# Patient Record
Sex: Female | Born: 1960 | Race: White | Hispanic: No | Marital: Married | Smoking: Former smoker
Health system: Southern US, Community
[De-identification: ages and names within clinical notes are randomized; demographics above are authoritative.]

## PROBLEM LIST (undated history)

## (undated) DIAGNOSIS — K219 Gastro-esophageal reflux disease without esophagitis: Secondary | ICD-10-CM

## (undated) DIAGNOSIS — E119 Type 2 diabetes mellitus without complications: Secondary | ICD-10-CM

## (undated) DIAGNOSIS — Z98891 History of uterine scar from previous surgery: Secondary | ICD-10-CM

## (undated) DIAGNOSIS — I10 Essential (primary) hypertension: Secondary | ICD-10-CM

---

## 2017-06-11 ENCOUNTER — Encounter (HOSPITAL_BASED_OUTPATIENT_CLINIC_OR_DEPARTMENT_OTHER): Payer: Self-pay | Admitting: *Deleted

## 2017-06-11 ENCOUNTER — Emergency Department (HOSPITAL_BASED_OUTPATIENT_CLINIC_OR_DEPARTMENT_OTHER): Payer: Worker's Compensation

## 2017-06-11 ENCOUNTER — Emergency Department (HOSPITAL_BASED_OUTPATIENT_CLINIC_OR_DEPARTMENT_OTHER)
Admission: EM | Admit: 2017-06-11 | Discharge: 2017-06-11 | Disposition: A | Discharge status: Home / Self Care | Payer: Worker's Compensation | Attending: Emergency Medicine | Admitting: Emergency Medicine

## 2017-06-11 DIAGNOSIS — W19XXXA Unspecified fall, initial encounter: Secondary | ICD-10-CM

## 2017-06-11 DIAGNOSIS — Z87891 Personal history of nicotine dependence: Secondary | ICD-10-CM | POA: Insufficient documentation

## 2017-06-11 DIAGNOSIS — I10 Essential (primary) hypertension: Secondary | ICD-10-CM | POA: Insufficient documentation

## 2017-06-11 DIAGNOSIS — M25561 Pain in right knee: Secondary | ICD-10-CM | POA: Diagnosis present

## 2017-06-11 DIAGNOSIS — E119 Type 2 diabetes mellitus without complications: Secondary | ICD-10-CM | POA: Diagnosis not present

## 2017-06-11 DIAGNOSIS — S8991XA Unspecified injury of right lower leg, initial encounter: Secondary | ICD-10-CM

## 2017-06-11 HISTORY — DX: Type 2 diabetes mellitus without complications: E11.9

## 2017-06-11 HISTORY — DX: Gastro-esophageal reflux disease without esophagitis: K21.9

## 2017-06-11 HISTORY — DX: Essential (primary) hypertension: I10

## 2017-06-11 HISTORY — DX: History of uterine scar from previous surgery: Z98.891

## 2017-06-11 NOTE — ED Provider Notes (Signed)
[facility] Provider Note   CSN: [id number] Arrival date & time: 06/11/17  1548     History   Chief Complaint Chief Complaint  Patient presents with  . Fall    HPI Jaime Rosales is a 56 y.o. female.  HPI   Jaime Rosales is a 56 y.o. female, with a history of DM and HTN, presenting to the ED with Primarily right knee pain following a fall that occurred earlier today. Patient rates her pain 2/10 while at rest, aching, located infrapatellar, nonradiating. Patient states she slipped and landed on her left knee and then her right knee. She has not taken medications for her symptoms prior to arrival. She is previously seen an orthopedist for an injury to her left knee, but cannot recall who this was. Denies head injury, LOC, neck/back pain, nausea/vomiting, numbness, tingling, weakness, or any other complaints.       Past Medical History:  Diagnosis Date  . Diabetes mellitus without complication (HCC)   . GERD (gastroesophageal reflux disease)   . Hx of cesarean section   . Hypertension     There are no active problems to display for this patient.   No past surgical history on file.  OB History    No data available       Home Medications    Prior to Admission medications   Not on File    Family History No family history on file.  Social History Social History  Substance Use Topics  . Smoking status: Former Games developer  . Smokeless tobacco: Not on file  . Alcohol use 0.6 oz/week    1 Glasses of wine per week     Allergies   Patient has no known allergies.   Review of Systems Review of Systems  Gastrointestinal: Negative for nausea and vomiting.  Musculoskeletal: Positive for arthralgias. Negative for back pain, joint swelling and neck pain.  Neurological: Negative for weakness, numbness and headaches.     Physical Exam Updated Vital Signs BP 134/72 (BP Location: Left Arm)   Pulse 90   Temp 98.9 F (37.2 C) (Oral)   Resp 20   Ht 5\' 2"   (1.575 m)   Wt 90.7 kg (200 lb)   SpO2 98%   BMI 36.58 kg/m   Physical Exam  Constitutional: She appears well-developed and well-nourished. No distress.  HENT:  Head: Normocephalic and atraumatic.  Eyes: Conjunctivae are normal.  Neck: Neck supple.  Cardiovascular: Normal rate, regular rhythm and intact distal pulses.   Pulmonary/Chest: Effort normal.  Musculoskeletal: She exhibits tenderness. She exhibits no edema or deformity.  Some tenderness on the right knee inferior to the patella. Patella is in correct anatomical position. Full passive and active range of motion in the bilateral hips, knees, and ankles. Patient is weightbearing without assistance.  No noted swelling, crepitus, deformity, laxity, or effusion with either knee. No tenderness with the left knee. No midline spinal tenderness.  Neurological: She is alert.  No noted sensory deficits in lower extremities. Strength with flexion and extension 5/5 in the bilateral hips, knees, and ankles. No gait deficit.  Skin: Skin is warm and dry. Capillary refill takes less than 2 seconds. She is not diaphoretic.  Psychiatric: She has a normal mood and affect. Her behavior is normal.  Nursing note and vitals reviewed.    ED Treatments / Results  Labs (all labs ordered are listed, but only abnormal results are displayed) Labs Reviewed - No data to display  EKG  EKG Interpretation  None       Radiology Dg Knee Complete 4 Views Left  Result Date: 06/11/2017 CLINICAL DATA:  56 year old female with bilateral knee pain status post fall, right greater than left. EXAM: LEFT KNEE - COMPLETE 4+ VIEW COMPARISON:  None. FINDINGS: No evidence of fracture, dislocation, or joint effusion. No evidence of arthropathy or other focal bone abnormality. Soft tissues are unremarkable. IMPRESSION: Negative. Electronically Signed   By: Sande Brothers M.D.   On: 06/11/2017 17:03   Dg Knee Complete 4 Views Right  Result Date: 06/11/2017 CLINICAL  DATA:  56 year old female with bilateral knee pain status post fall, right greater than left. EXAM: RIGHT KNEE - COMPLETE 4+ VIEW COMPARISON:  None. FINDINGS: No evidence of fracture or dislocation. There is a small suprapatellar effusion. No evidence of arthropathy or other focal bone abnormality. Soft tissues are unremarkable. IMPRESSION: Small suprapatellar effusion without fracture or dislocation. Electronically Signed   By: Sande Brothers M.D.   On: 06/11/2017 17:02    Procedures Procedures (including critical care time)  Medications Ordered in ED Medications - No data to display   Initial Impression / Assessment and Plan / ED Course  I have reviewed the triage vital signs and the nursing notes.  Pertinent labs & imaging results that were available during my care of the patient were reviewed by me and considered in my medical decision making (see chart for details).     Patient presents for evaluation of a knee injury. No noted significant abnormality on exam. No significant acute abnormality on x-ray. Orthopedic follow-up. Patient ambulatory. The patient was given instructions for home care as well as return precautions. Patient voices understanding of these instructions, accepts the plan, and is comfortable with discharge.     Final Clinical Impressions(s) / ED Diagnoses   Final diagnoses:  Fall, initial encounter  Injury of right knee, initial encounter    New Prescriptions There are no discharge medications for this patient.    Anselm Pancoast, PA-C 06/12/17 0157    Melene Plan, DO 06/12/17 2120

## 2017-06-11 NOTE — Discharge Instructions (Signed)
You have been seen today for a knee injury. There were no acute abnormalities on the x-rays, including no sign of fracture or dislocation. Pain: Take 600 mg of ibuprofen every 6 hours or 440 mg (over the counter dose) to 500 mg (prescription dose) of naproxen every 12 hours or for the next 3 days. After this time, these medications may be used as needed for pain. Take these medications with food to avoid upset stomach. Choose only one of these medications, do not take them together.  Tylenol: Should you continue to have additional pain while taking the ibuprofen or naproxen, you may add in tylenol as needed. Your daily total maximum amount of tylenol from all sources should be limited to 4000mg /day for persons without liver problems, or 2000mg /day for those with liver problems. Ice: May apply ice to the area over the next 24 hours for 15 minutes at a time to reduce swelling. Elevation: Keep the extremity elevated as often as possible to reduce pain and inflammation. Support: Wear the knee sleeve for support and comfort. Wear this until pain resolves. You will be weight-bearing as tolerated, which means you can slowly start to put weight on the extremity and increase amount and frequency as pain allows. Exercises: Start by performing these exercises a few times a week, increasing the frequency until you are performing them twice daily.  Follow up: Follow up with a PCP or orthopedist on this matter.

## 2017-06-11 NOTE — ED Triage Notes (Signed)
Pt states she fell today at work, slipped on wet fluid, fell on left knee first, then rt knee. Denies hitting head or any other complaints. Rest improves pain, but getting up and down and ambulating makes pain worse in knees. Denies any knee surgeries

## 2021-01-21 ENCOUNTER — Telehealth: Payer: Self-pay | Admitting: *Deleted

## 2021-01-21 NOTE — Telephone Encounter (Signed)
Per referral 01/21/21 - called and gave patient upcoming appointments and mailed calendar with welcome packet

## 2021-02-21 ENCOUNTER — Other Ambulatory Visit: Payer: Self-pay | Admitting: Family

## 2021-02-21 DIAGNOSIS — D61818 Other pancytopenia: Secondary | ICD-10-CM

## 2021-02-21 DIAGNOSIS — R809 Proteinuria, unspecified: Secondary | ICD-10-CM

## 2021-02-21 DIAGNOSIS — D649 Anemia, unspecified: Secondary | ICD-10-CM

## 2021-02-21 DIAGNOSIS — R778 Other specified abnormalities of plasma proteins: Secondary | ICD-10-CM

## 2021-02-22 ENCOUNTER — Inpatient Hospital Stay: Payer: BC Managed Care – PPO | Attending: Hematology & Oncology

## 2021-02-22 ENCOUNTER — Encounter: Payer: Self-pay | Admitting: Family

## 2021-02-22 ENCOUNTER — Inpatient Hospital Stay (HOSPITAL_BASED_OUTPATIENT_CLINIC_OR_DEPARTMENT_OTHER): Payer: BC Managed Care – PPO | Admitting: Family

## 2021-02-22 ENCOUNTER — Other Ambulatory Visit: Payer: Self-pay

## 2021-02-22 VITALS — BP 129/58 | HR 99 | Temp 98.9°F | Resp 17 | Ht 62.0 in | Wt 209.0 lb

## 2021-02-22 DIAGNOSIS — D72819 Decreased white blood cell count, unspecified: Secondary | ICD-10-CM | POA: Diagnosis not present

## 2021-02-22 DIAGNOSIS — J45909 Unspecified asthma, uncomplicated: Secondary | ICD-10-CM | POA: Diagnosis not present

## 2021-02-22 DIAGNOSIS — Z801 Family history of malignant neoplasm of trachea, bronchus and lung: Secondary | ICD-10-CM | POA: Diagnosis not present

## 2021-02-22 DIAGNOSIS — K7460 Unspecified cirrhosis of liver: Secondary | ICD-10-CM

## 2021-02-22 DIAGNOSIS — D61818 Other pancytopenia: Secondary | ICD-10-CM

## 2021-02-22 DIAGNOSIS — R161 Splenomegaly, not elsewhere classified: Secondary | ICD-10-CM

## 2021-02-22 DIAGNOSIS — Z87891 Personal history of nicotine dependence: Secondary | ICD-10-CM | POA: Diagnosis not present

## 2021-02-22 DIAGNOSIS — D649 Anemia, unspecified: Secondary | ICD-10-CM

## 2021-02-22 DIAGNOSIS — R809 Proteinuria, unspecified: Secondary | ICD-10-CM

## 2021-02-22 LAB — CMP (CANCER CENTER ONLY)
ALT: 47 U/L — ABNORMAL HIGH (ref 0–44)
AST: 109 U/L — ABNORMAL HIGH (ref 15–41)
Albumin: 3.2 g/dL — ABNORMAL LOW (ref 3.5–5.0)
Alkaline Phosphatase: 124 U/L (ref 38–126)
Anion gap: 9 (ref 5–15)
BUN: 11 mg/dL (ref 6–20)
CO2: 25 mmol/L (ref 22–32)
Calcium: 9.3 mg/dL (ref 8.9–10.3)
Chloride: 103 mmol/L (ref 98–111)
Creatinine: 1 mg/dL (ref 0.44–1.00)
GFR, Estimated: >60 mL/min (ref >60)
Glucose, Bld: 109 mg/dL — ABNORMAL HIGH (ref 70–99)
Potassium: 3.5 mmol/L (ref 3.5–5.1)
Sodium: 137 mmol/L (ref 135–145)
Total Bilirubin: 2 mg/dL — ABNORMAL HIGH (ref 0.3–1.2)
Total Protein: 6.7 g/dL (ref 6.5–8.1)

## 2021-02-22 LAB — CBC WITH DIFFERENTIAL (CANCER CENTER ONLY)
Abs Immature Granulocytes: 0.02 10*3/uL (ref 0.00–0.07)
Basophils Absolute: 0 10*3/uL (ref 0.0–0.1)
Basophils Relative: 1 %
Eosinophils Absolute: 0.2 10*3/uL (ref 0.0–0.5)
Eosinophils Relative: 6 %
HCT: 36.4 % (ref 36.0–46.0)
Hemoglobin: 11.9 g/dL — ABNORMAL LOW (ref 12.0–15.0)
Immature Granulocytes: 1 %
Lymphocytes Relative: 17 %
Lymphs Abs: 0.6 10*3/uL — ABNORMAL LOW (ref 0.7–4.0)
MCH: 31.2 pg (ref 26.0–34.0)
MCHC: 32.7 g/dL (ref 30.0–36.0)
MCV: 95.3 fL (ref 80.0–100.0)
Monocytes Absolute: 0.4 10*3/uL (ref 0.1–1.0)
Monocytes Relative: 12 %
Neutro Abs: 2.2 10*3/uL (ref 1.7–7.7)
Neutrophils Relative %: 63 %
Platelet Count: 88 10*3/uL — ABNORMAL LOW (ref 150–400)
RBC: 3.82 MIL/uL — ABNORMAL LOW (ref 3.87–5.11)
RDW: 14.6 % (ref 11.5–15.5)
WBC Count: 3.4 10*3/uL — ABNORMAL LOW (ref 4.0–10.5)
nRBC: 0 % (ref 0.0–0.2)

## 2021-02-22 LAB — RETICULOCYTES
Immature Retic Fract: 9.5 % (ref 2.3–15.9)
RBC.: 3.9 MIL/uL (ref 3.87–5.11)
Retic Count, Absolute: 66.7 10*3/uL (ref 19.0–186.0)
Retic Ct Pct: 1.7 % (ref 0.4–3.1)

## 2021-02-22 LAB — SAVE SMEAR(SSMR), FOR PROVIDER SLIDE REVIEW

## 2021-02-22 LAB — LACTATE DEHYDROGENASE: LDH: 178 U/L (ref 98–192)

## 2021-02-22 NOTE — Progress Notes (Signed)
Hematology/Oncology Consultation   Name: Jaime Rosales      MRN: [id number]    Location: Room/bed info not found  Date: 02/22/2021 Time:10:32 AM   REFERRING PHYSICIAN: Jamal Collin, PA-C  REASON FOR CONSULT: Thrombocytopenia    DIAGNOSIS: Mild thrombocytopenia/pancytopenia   HISTORY OF PRESENT ILLNESS:  Jaime Rosales is a very pleasant 60 yo caucasian female with mild leukopenia first noted in February 2021. She now has mild pancytopenia noted over the last few months. Hgb today is 11.9, MCV 95, WBC count 3.4 and platelets 88.  No abnormal blood loss. No petechiae.  She is symptomatic with fatigue and intermittent nausea, no vomiting for the last 2-3 months.  She has no appetite and stays thirsty a lot of the time. She states that she has lost 20 lbs over the last 6 months.  No family history of pancytopenia or thrombocytopenia.  She was recently in the hospital for AKI and CKD III felt to be due to NSAID use for hip pain. 24 hour urine protein at that time was 756. They stopped her lisinopril as well. She has followed up with nephrology and BUN and creatinine today are within normal limits.  No adenopathy or lymphedema noted on exam.  No personal history of cancer. Maternal grandmother had lung cancer, smoker.  She has 2 adult children and no history of miscarriage.  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  She states that her blood sugars are managed effectively on Metformin, Janumet and Rybelsus. No history of thyroid disease.  No fever, chills, cough, dizziness, SOB, chest pain, palpitations, abdominal pain/bloating or changes in bowel or bladder habits.  She has constipation at times.  She has asthma and uses an inhaler as needed. She has seasonal allergies and occasionally has a little blood on her tissue when she blows her nose.  She has an itchy red sandpaper looking rash on her neck, chest and abdomen that waxes and wanes.  She  has shoulder and neck pain that comes and goes.  No swelling, tenderness, numbness or tingling in her extremities.  No falls or syncope. She has been too tired lately to exercise.  She does not smoke. No recreational drug use.  She does enjoy 2 glasses of wine or gin and tonics each evening.  Husband present and daughter on phone during visit.   ROS: All other 10 point review of systems is negative.   PAST MEDICAL HISTORY:   Past Medical History:  Diagnosis Date  . Diabetes mellitus without complication (HCC)   . GERD (gastroesophageal reflux disease)   . Hx of cesarean section   . Hypertension     ALLERGIES: No Known Allergies    MEDICATIONS:  No current outpatient medications on file prior to visit.   No current facility-administered medications on file prior to visit.     PAST SURGICAL HISTORY Discussed with patient.   FAMILY HISTORY: No family history on file.  SOCIAL HISTORY:  reports that she has quit smoking. She does not have any smokeless tobacco history on file. She reports current alcohol use of about 1.0 standard drink of alcohol per week. She reports that she does not use drugs.  PERFORMANCE STATUS: The patient's performance status is 1 - Symptomatic but completely ambulatory  PHYSICAL EXAM: Most Recent Vital Signs: There were no vitals taken for this visit. BP (!) 129/58 (BP Location: Left Arm, Patient Position: Sitting)   Pulse 99   Temp 98.9  F (37.2 C) (Oral)   Resp 17   Ht 5\' 2"  (1.575 m)   Wt 209 lb (94.8 kg)   SpO2 99%   BMI 38.23 kg/m   General Appearance:    Alert, cooperative, no distress, appears stated age  Head:    Normocephalic, without obvious abnormality, atraumatic  Eyes:    PERRL, conjunctiva/corneas clear, EOM's intact, fundi    benign, both eyes        Throat:   Lips, mucosa, and tongue normal; teeth and gums normal  Neck:   Supple, symmetrical, trachea midline, no adenopathy;    thyroid:  no enlargement/tenderness/nodules;  no carotid   bruit or JVD  Back:     Symmetric, no curvature, ROM normal, no CVA tenderness  Lungs:     Clear to auscultation bilaterally, respirations unlabored  Chest Wall:    No tenderness or deformity   Heart:    Regular rate and rhythm, S1 and S2 normal, no murmur, rub   or gallop     Abdomen:     Soft, non-tender, bowel sounds active all four quadrants,    no masses, no organomegaly        Extremities:   Extremities normal, atraumatic, no cyanosis or edema  Pulses:   2+ and symmetric all extremities  Skin:   Skin color, texture, turgor normal, no rashes or lesions  Lymph nodes:   Cervical, supraclavicular, and axillary nodes normal  Neurologic:   CNII-XII intact, normal strength, sensation and reflexes    throughout    LABORATORY DATA:  Results for orders placed or performed in visit on 02/22/21 (from the past 48 hour(s))  Save Smear (SSMR)     Status: None   Collection Time: 02/22/21 10:16 AM  Result Value Ref Range   Smear Review SMEAR STAINED AND AVAILABLE FOR REVIEW     Comment: Performed at [facility] Lab at [facility], [address] [place], [place]  CBC with Differential (Cancer Center Only)     Status: Abnormal   Collection Time: 02/22/21 10:16 AM  Result Value Ref Range   WBC Count 3.4 (L) 4.0 - 10.5 K/uL   RBC 3.82 (L) 3.87 - 5.11 MIL/uL   Hemoglobin 11.9 (L) 12.0 - 15.0 g/dL   HCT 04/24/21 44.0 - 34.7 %   MCV 95.3 80.0 - 100.0 fL   MCH 31.2 26.0 - 34.0 pg   MCHC 32.7 30.0 - 36.0 g/dL   RDW 42.5 95.6 - 38.7 %   Platelet Count 88 (L) 150 - 400 K/uL   nRBC 0.0 0.0 - 0.2 %   Neutrophils Relative % 63 %   Neutro Abs 2.2 1.7 - 7.7 K/uL   Lymphocytes Relative 17 %   Lymphs Abs 0.6 (L) 0.7 - 4.0 K/uL   Monocytes Relative 12 %   Monocytes Absolute 0.4 0.1 - 1.0 K/uL   Eosinophils Relative 6 %   Eosinophils Absolute 0.2 0.0 - 0.5 K/uL   Basophils Relative 1 %   Basophils Absolute 0.0 0.0 - 0.1 K/uL   Immature Granulocytes 1 %    Abs Immature Granulocytes 0.02 0.00 - 0.07 K/uL    Comment: Performed at [facility] at [facility], [address] [place], [place]  Reticulocytes     Status: None   Collection Time: 02/22/21 10:17 AM  Result Value Ref Range   Retic Ct Pct 1.7 0.4 - 3.1 %   RBC.  3.90 3.87 - 5.11 MIL/uL   Retic Count, Absolute 66.7 19.0 - 186.0 K/uL   Immature Retic Fract 9.5 2.3 - 15.9 %    Comment: Performed at [facility] Lab at [facility], [address] [place], [place] [address]      RADIOGRAPHY: No results found.     PATHOLOGY: None  ASSESSMENT/PLAN:  Ms. Emmitt is a very pleasant 60 yo caucasian female with mild leukopenia first noted in February 2021 and now has had mild pancytopenia over the last few months. Iron studies were stable.  Light chains and IGA/IgG levels were slightly elevated without any M-spike detected. This appears to be reactive.  Blood smear reviewed with Dr. Myna Hidalgo. No obvious evidence of malignancy noted. Cells appear well developed, platelets slightly large.  We will get an [place] to assess both the liver and spleen.  We will plan to see her again in another 3 months.   All questions were answered and she is in agreement with the plan. The patient knows to call the clinic with any problems, questions or concerns. We can certainly see the patient much sooner if necessary.  The patient was discussed with Dr. Myna Hidalgo and he is in agreement with the aforementioned.   Emeline Gins, NP

## 2021-02-23 ENCOUNTER — Telehealth: Payer: Self-pay | Admitting: *Deleted

## 2021-02-23 LAB — KAPPA/LAMBDA LIGHT CHAINS
Kappa free light chain: 68 mg/L — ABNORMAL HIGH (ref 3.3–19.4)
Kappa, lambda light chain ratio: 1.47 (ref 0.26–1.65)
Lambda free light chains: 46.3 mg/L — ABNORMAL HIGH (ref 5.7–26.3)

## 2021-02-23 LAB — IRON AND TIBC
Iron: 63 ug/dL (ref 41–142)
Saturation Ratios: 38 % (ref 21–57)
TIBC: 168 ug/dL — ABNORMAL LOW (ref 236–444)
UIBC: 105 ug/dL — ABNORMAL LOW (ref 120–384)

## 2021-02-23 LAB — FERRITIN: Ferritin: 170 ng/mL (ref 11–307)

## 2021-02-23 LAB — IGG, IGA, IGM
IgA: 939 mg/dL — ABNORMAL HIGH (ref 87–352)
IgG (Immunoglobin G), Serum: 1740 mg/dL — ABNORMAL HIGH (ref 586–1602)
IgM (Immunoglobulin M), Srm: 114 mg/dL (ref 26–217)

## 2021-02-23 NOTE — Telephone Encounter (Signed)
No 02/23/21 los to schedule

## 2021-02-24 LAB — PROTEIN ELECTROPHORESIS, SERUM
A/G Ratio: 1 (ref 0.7–1.7)
Albumin ELP: 3.3 g/dL (ref 2.9–4.4)
Alpha-1-Globulin: 0.2 g/dL (ref 0.0–0.4)
Alpha-2-Globulin: 0.3 g/dL — ABNORMAL LOW (ref 0.4–1.0)
Beta Globulin: 1.1 g/dL (ref 0.7–1.3)
Gamma Globulin: 1.8 g/dL (ref 0.4–1.8)
Globulin, Total: 3.4 g/dL (ref 2.2–3.9)
Total Protein ELP: 6.7 g/dL (ref 6.0–8.5)

## 2021-02-25 ENCOUNTER — Ambulatory Visit (HOSPITAL_BASED_OUTPATIENT_CLINIC_OR_DEPARTMENT_OTHER): Payer: BC Managed Care – PPO

## 2021-03-01 ENCOUNTER — Encounter: Payer: Self-pay | Admitting: *Deleted

## 2021-03-01 ENCOUNTER — Telehealth: Payer: Self-pay | Admitting: *Deleted

## 2021-03-01 NOTE — Telephone Encounter (Signed)
Per 5/10/los - called and lvm of upcoming appointments - mailed calendar - view mychart

## 2021-03-04 ENCOUNTER — Ambulatory Visit (HOSPITAL_BASED_OUTPATIENT_CLINIC_OR_DEPARTMENT_OTHER)
Admission: RE | Admit: 2021-03-04 | Discharge: 2021-03-04 | Disposition: A | Discharge status: Home / Self Care | Payer: BC Managed Care – PPO | Source: Ambulatory Visit | Attending: Family | Admitting: Family

## 2021-03-04 ENCOUNTER — Other Ambulatory Visit: Payer: Self-pay

## 2021-03-04 DIAGNOSIS — R161 Splenomegaly, not elsewhere classified: Secondary | ICD-10-CM | POA: Insufficient documentation

## 2021-03-04 DIAGNOSIS — K7460 Unspecified cirrhosis of liver: Secondary | ICD-10-CM | POA: Insufficient documentation

## 2021-03-07 ENCOUNTER — Encounter: Payer: Self-pay | Admitting: *Deleted

## 2021-03-18 ENCOUNTER — Telehealth: Payer: Self-pay | Admitting: *Deleted

## 2021-03-18 NOTE — Telephone Encounter (Signed)
Patient requested that I call and review Ultrasound results with her daughter Gale Journey. Her number is [phone]. I left a message on her cell phone informing her that I would call next week since, its after business hours.

## 2021-03-25 ENCOUNTER — Telehealth: Payer: Self-pay

## 2021-03-25 ENCOUNTER — Other Ambulatory Visit: Payer: Self-pay | Admitting: Family

## 2021-03-25 ENCOUNTER — Telehealth: Payer: Self-pay | Admitting: Family

## 2021-03-25 DIAGNOSIS — R778 Other specified abnormalities of plasma proteins: Secondary | ICD-10-CM

## 2021-03-25 DIAGNOSIS — D509 Iron deficiency anemia, unspecified: Secondary | ICD-10-CM

## 2021-03-25 NOTE — Telephone Encounter (Signed)
I was able to speak with Jaime Rosales' daughter Jaime Rosales and go over her recent lab work and [place] in detail. Her mother is cutting back on her ETOH intake, reducing fatty foods and incorporating exercise into her daily routine. We will continue to follow along with labs and follow-up in 3 months. No other questions or concerns at this time. Daughter appreciative of call.

## 2021-05-04 ENCOUNTER — Inpatient Hospital Stay: Payer: BC Managed Care – PPO | Admitting: Hematology & Oncology

## 2021-05-04 ENCOUNTER — Other Ambulatory Visit: Payer: Self-pay

## 2021-05-04 ENCOUNTER — Encounter: Payer: Self-pay | Admitting: Hematology & Oncology

## 2021-05-04 ENCOUNTER — Inpatient Hospital Stay: Payer: BC Managed Care – PPO | Attending: Hematology & Oncology

## 2021-05-04 VITALS — BP 129/56 | HR 86 | Temp 98.2°F | Resp 18 | Wt 196.0 lb

## 2021-05-04 DIAGNOSIS — E119 Type 2 diabetes mellitus without complications: Secondary | ICD-10-CM | POA: Insufficient documentation

## 2021-05-04 DIAGNOSIS — K760 Fatty (change of) liver, not elsewhere classified: Secondary | ICD-10-CM | POA: Insufficient documentation

## 2021-05-04 DIAGNOSIS — R161 Splenomegaly, not elsewhere classified: Secondary | ICD-10-CM | POA: Diagnosis not present

## 2021-05-04 DIAGNOSIS — D509 Iron deficiency anemia, unspecified: Secondary | ICD-10-CM

## 2021-05-04 DIAGNOSIS — R778 Other specified abnormalities of plasma proteins: Secondary | ICD-10-CM

## 2021-05-04 DIAGNOSIS — Z7984 Long term (current) use of oral hypoglycemic drugs: Secondary | ICD-10-CM | POA: Insufficient documentation

## 2021-05-04 DIAGNOSIS — D649 Anemia, unspecified: Secondary | ICD-10-CM

## 2021-05-04 DIAGNOSIS — D61818 Other pancytopenia: Secondary | ICD-10-CM | POA: Diagnosis not present

## 2021-05-04 DIAGNOSIS — D696 Thrombocytopenia, unspecified: Secondary | ICD-10-CM | POA: Insufficient documentation

## 2021-05-04 DIAGNOSIS — D72819 Decreased white blood cell count, unspecified: Secondary | ICD-10-CM | POA: Insufficient documentation

## 2021-05-04 LAB — CBC WITH DIFFERENTIAL (CANCER CENTER ONLY)
Abs Immature Granulocytes: 0.02 10*3/uL (ref 0.00–0.07)
Basophils Absolute: 0 10*3/uL (ref 0.0–0.1)
Basophils Relative: 1 %
Eosinophils Absolute: 0.2 10*3/uL (ref 0.0–0.5)
Eosinophils Relative: 6 %
HCT: 35.2 % — ABNORMAL LOW (ref 36.0–46.0)
Hemoglobin: 11.6 g/dL — ABNORMAL LOW (ref 12.0–15.0)
Immature Granulocytes: 1 %
Lymphocytes Relative: 24 %
Lymphs Abs: 0.7 10*3/uL (ref 0.7–4.0)
MCH: 31.2 pg (ref 26.0–34.0)
MCHC: 33 g/dL (ref 30.0–36.0)
MCV: 94.6 fL (ref 80.0–100.0)
Monocytes Absolute: 0.3 10*3/uL (ref 0.1–1.0)
Monocytes Relative: 11 %
Neutro Abs: 1.5 10*3/uL — ABNORMAL LOW (ref 1.7–7.7)
Neutrophils Relative %: 57 %
Platelet Count: 66 10*3/uL — ABNORMAL LOW (ref 150–400)
RBC: 3.72 MIL/uL — ABNORMAL LOW (ref 3.87–5.11)
RDW: 15.9 % — ABNORMAL HIGH (ref 11.5–15.5)
WBC Count: 2.7 10*3/uL — ABNORMAL LOW (ref 4.0–10.5)
nRBC: 0 % (ref 0.0–0.2)

## 2021-05-04 LAB — CMP (CANCER CENTER ONLY)
ALT: 27 U/L (ref 0–44)
AST: 60 U/L — ABNORMAL HIGH (ref 15–41)
Albumin: 3.3 g/dL — ABNORMAL LOW (ref 3.5–5.0)
Alkaline Phosphatase: 108 U/L (ref 38–126)
Anion gap: 9 (ref 5–15)
BUN: 12 mg/dL (ref 6–20)
CO2: 24 mmol/L (ref 22–32)
Calcium: 9.6 mg/dL (ref 8.9–10.3)
Chloride: 105 mmol/L (ref 98–111)
Creatinine: 1.13 mg/dL — ABNORMAL HIGH (ref 0.44–1.00)
GFR, Estimated: 56 mL/min — ABNORMAL LOW (ref >60)
Glucose, Bld: 115 mg/dL — ABNORMAL HIGH (ref 70–99)
Potassium: 3.7 mmol/L (ref 3.5–5.1)
Sodium: 138 mmol/L (ref 135–145)
Total Bilirubin: 1.9 mg/dL — ABNORMAL HIGH (ref 0.3–1.2)
Total Protein: 6.8 g/dL (ref 6.5–8.1)

## 2021-05-04 LAB — SAVE SMEAR(SSMR), FOR PROVIDER SLIDE REVIEW

## 2021-05-04 LAB — LACTATE DEHYDROGENASE: LDH: 164 U/L (ref 98–192)

## 2021-05-04 NOTE — Progress Notes (Signed)
Hematology and Oncology Follow Up Visit  Jaime Rosales [id number] [birth year] 60 y.o. 05/04/2021   Principle Diagnosis:  Pancytopenia-hepatic steatosis/cirrhosis and splenomegaly  Current Therapy:   Observation     Interim History:  Jaime Rosales is back for second office visit.  We first saw her back in May.  She is a very nice.  She is a 60 year old white female.  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  She does have diabetes.  She does have hepatic steatosis and possible cirrhosis.  She clearly has spider telangiectasias on her upper chest and upper back.  She was seen in May.  At that time, her work-up showed iron studies with a ferritin of 170 with iron saturation of 38%.  She did not have a monoclonal spike in her blood.  She did have elevated IgA level of 939 mg/dL.  Her IgG level slightly elevated at 1740 mg/dL.  She did have a ultrasound of the liver and spleen.  This was done on May 20.  She did have splenomegaly with a splenic volume of 1250 cm.  She had mild hepatic steatosis and hepatomegaly.  She does have some bruising.  There is been no bleeding.  She has had no fever.  There is been no problems with COVID.  She has had some leg swelling which is chronic.  She says that her last hemoglobin A1c was 5.8.  There is been no issues with cough or shortness of breath.  She does not smoke.  She has occasional alcoholic beverage but not much.  There is been no mouth sores.  She has had no headache.  She has had fatigue.  She is little bit worried about going back to work feeling tired.  Overall, I would have to say her performance status is ECOG 1.  Medications:  Current Outpatient Medications:    melatonin 5 MG TABS, Take 5 mg by mouth at bedtime as needed., Disp: , Rfl:    ergocalciferol (VITAMIN D2) 1.25 MG (50000 UT) capsule, TAKE 1 CAPSULE BY MOUTH ONE TIME PER WEEK, Disp: , Rfl:    fexofenadine (ALLEGRA) 180 MG tablet, Take 180 mg by mouth daily., Disp: ,  Rfl:    JANUMET 50-500 MG tablet, Take 1 tablet by mouth 2 (two) times daily., Disp: , Rfl:    omeprazole (PRILOSEC) 40 MG capsule, Take 1 tablet by mouth daily., Disp: , Rfl:    rosuvastatin (CRESTOR) 10 MG tablet, Take 1 tablet by mouth daily., Disp: , Rfl:    RYBELSUS 7 MG TABS, Take 1 tablet by mouth daily., Disp: , Rfl:    sertraline (ZOLOFT) 50 MG tablet, Take 1 tablet by mouth daily., Disp: , Rfl:   Allergies:  Allergies  Allergen Reactions   Fenofibrate Other (See Comments)    Patient states it had similar extremity achiness as the statin Patient states it had similar extremity achiness as the statin    Statins Other (See Comments)    Bilateral lower extremity pain Bilateral lower extremity pain     Past Medical History, Surgical history, Social history, and Family History were reviewed and updated.  Review of Systems: Review of Systems  Constitutional:  Positive for fatigue.  HENT:  Negative.    Eyes: Negative.   Respiratory: Negative.    Cardiovascular: Negative.   Gastrointestinal: Negative.   Endocrine: Negative.   Genitourinary: Negative.    Musculoskeletal: Negative.   Skin: Negative.   Neurological: Negative.   Hematological: Negative.   Psychiatric/Behavioral: Negative.  Physical Exam:  weight is 196 lb (88.9 kg). Her oral temperature is 98.2 F (36.8 C). Her blood pressure is 129/56 (abnormal) and her pulse is 86. Her respiration is 18 and oxygen saturation is 94%.   Wt Readings from Last 3 Encounters:  05/04/21 196 lb (88.9 kg)  02/22/21 209 lb (94.8 kg)  06/11/17 200 lb (90.7 kg)    Physical Exam Vitals reviewed.  HENT:     Head: Normocephalic and atraumatic.  Eyes:     Pupils: Pupils are equal, round, and reactive to light.  Cardiovascular:     Rate and Rhythm: Normal rate and regular rhythm.     Heart sounds: Normal heart sounds.  Pulmonary:     Effort: Pulmonary effort is normal.     Breath sounds: Normal breath sounds.  Abdominal:      General: Bowel sounds are normal.     Palpations: Abdomen is soft.     Comments: Abdominal exam is somewhat obese.  She has decent bowel sounds.  There is no fluid wave.  She has no guarding or rebound tenderness.  Her spleen tip is about 3 cm below the left costal margin.  I cannot palpate her liver edge.  Musculoskeletal:        General: No tenderness or deformity. Normal range of motion.     Cervical back: Normal range of motion.     Comments: Extremities does show some mild nonpitting edema in her lower legs.  Lymphadenopathy:     Cervical: No cervical adenopathy.  Skin:    General: Skin is warm and dry.     Findings: No erythema or rash.     Comments: She does have a spider telangiectasias on her upper chest wall and upper back.  Neurological:     Mental Status: She is alert and oriented to person, place, and time.  Psychiatric:        Behavior: Behavior normal.        Thought Content: Thought content normal.        Judgment: Judgment normal.     Lab Results  Component Value Date   WBC 2.7 (L) 05/04/2021   HGB 11.6 (L) 05/04/2021   HCT 35.2 (L) 05/04/2021   MCV 94.6 05/04/2021   PLT 66 (L) 05/04/2021     Chemistry      Component Value Date/Time   NA 138 05/04/2021 1508   K 3.7 05/04/2021 1508   CL 105 05/04/2021 1508   CO2 24 05/04/2021 1508   BUN 12 05/04/2021 1508   CREATININE 1.13 (H) 05/04/2021 1508      Component Value Date/Time   CALCIUM 9.6 05/04/2021 1508   ALKPHOS 108 05/04/2021 1508   AST 60 (H) 05/04/2021 1508   ALT 27 05/04/2021 1508   BILITOT 1.9 (H) 05/04/2021 1508      Impression and Plan: Jaime Rosales is a very charming 60 year old white female.  She has moderate leukopenia and thrombocytopenia.  Always had a blood smear.  I do not see any nucleated red blood cells.  There is no rouleaux formation.  There was no teardrop cells.  She had no schistocytes or spherocytes.  White blood cells appear normal in morphology and maturation.  I do not  see any obvious hypersegmented polys.  There were no immature myeloid or lymphoid forms.  Platelets were decreased in number.  Platelets were small in size.  Have to believe that the pancytopenia is probably related to the hepatic steatosis and splenomegaly.  At  the present time, I do not see that we have to embark upon any kind of treatment for this.  I do not see that she has to have a bone marrow biopsy.  I do still think this would give [place] any additional information.  Her blood smear certainly did not look like a suspicious smear for any type of hematologic condition or lymphoproliferative disorder.  I realize that she does have slightly elevated IgG and IgA levels.  For right now, I does think that we have to follow her along.  Again, I do not see that we have to be invasive.  I will see that we have to do any other scans on her.  Down the road, I probably would do another ultrasound to see if there is any change in her liver or spleen size.  I told her if she wanted to go back to work I would have no problems with her doing that.  If she had difficulties with worsening fatigue, then we might have to think about getting her out of work.  It was very nice talking with her.  She is going to go on a vacation down to [facility] with 16 people this weekend.  It sounds like it will be a very exciting time for her.     Volanda Napoleon, MD 7/20/20225:08 PM

## 2021-05-05 ENCOUNTER — Telehealth: Payer: Self-pay | Admitting: Hematology & Oncology

## 2021-05-05 LAB — KAPPA/LAMBDA LIGHT CHAINS
Kappa free light chain: 66.9 mg/L — ABNORMAL HIGH (ref 3.3–19.4)
Kappa, lambda light chain ratio: 1.64 (ref 0.26–1.65)
Lambda free light chains: 40.9 mg/L — ABNORMAL HIGH (ref 5.7–26.3)

## 2021-05-05 LAB — IGG, IGA, IGM
IgA: 889 mg/dL — ABNORMAL HIGH (ref 87–352)
IgG (Immunoglobin G), Serum: 1669 mg/dL — ABNORMAL HIGH (ref 586–1602)
IgM (Immunoglobulin M), Srm: 120 mg/dL (ref 26–217)

## 2021-05-05 NOTE — Telephone Encounter (Signed)
Appointments scheduled per 7/20 los patient has My Chart for updates 

## 2021-05-06 LAB — IRON AND TIBC
Iron: 52 ug/dL (ref 28–170)
Saturation Ratios: 22 % (ref 10.4–31.8)
TIBC: 240 ug/dL — ABNORMAL LOW (ref 250–450)
UIBC: 188 ug/dL

## 2021-05-06 LAB — FERRITIN: Ferritin: 62 ng/mL (ref 11–307)

## 2021-05-10 LAB — PROTEIN ELECTROPHORESIS, SERUM
A/G Ratio: 0.9 (ref 0.7–1.7)
Albumin ELP: 2.9 g/dL (ref 2.9–4.4)
Alpha-1-Globulin: 0.3 g/dL (ref 0.0–0.4)
Alpha-2-Globulin: 0.3 g/dL — ABNORMAL LOW (ref 0.4–1.0)
Beta Globulin: 1.1 g/dL (ref 0.7–1.3)
Gamma Globulin: 1.6 g/dL (ref 0.4–1.8)
Globulin, Total: 3.4 g/dL (ref 2.2–3.9)
Total Protein ELP: 6.3 g/dL (ref 6.0–8.5)

## 2021-05-25 ENCOUNTER — Other Ambulatory Visit: Payer: BC Managed Care – PPO

## 2021-05-25 ENCOUNTER — Ambulatory Visit: Payer: BC Managed Care – PPO | Admitting: Hematology & Oncology

## 2021-06-22 ENCOUNTER — Other Ambulatory Visit: Payer: BC Managed Care – PPO

## 2021-06-22 ENCOUNTER — Ambulatory Visit: Payer: BC Managed Care – PPO | Admitting: Hematology & Oncology

## 2021-07-20 ENCOUNTER — Telehealth: Payer: Self-pay | Admitting: Hematology & Oncology

## 2021-07-20 NOTE — Telephone Encounter (Signed)
LMVM for patient about appointments being changed due to Dr Myna Hidalgo being on [facility] 11/8.

## 2021-08-23 ENCOUNTER — Inpatient Hospital Stay: Payer: BC Managed Care – PPO

## 2021-08-23 ENCOUNTER — Inpatient Hospital Stay: Payer: BC Managed Care – PPO | Admitting: Hematology & Oncology

## 2021-09-06 ENCOUNTER — Inpatient Hospital Stay: Payer: BC Managed Care – PPO

## 2021-09-06 ENCOUNTER — Inpatient Hospital Stay: Payer: BC Managed Care – PPO | Admitting: Hematology & Oncology

## 2021-10-20 IMAGING — US US ABDOMEN COMPLETE
1 series · 13 of 25 positions shown · non-contrast
Comparison: None.

CLINICAL DATA: Pancytopenia

EXAM:
ABDOMEN ULTRASOUND COMPLETE

[Series 1: us abdomen complete · 13 of 73 slices shown]
[im 1/73]
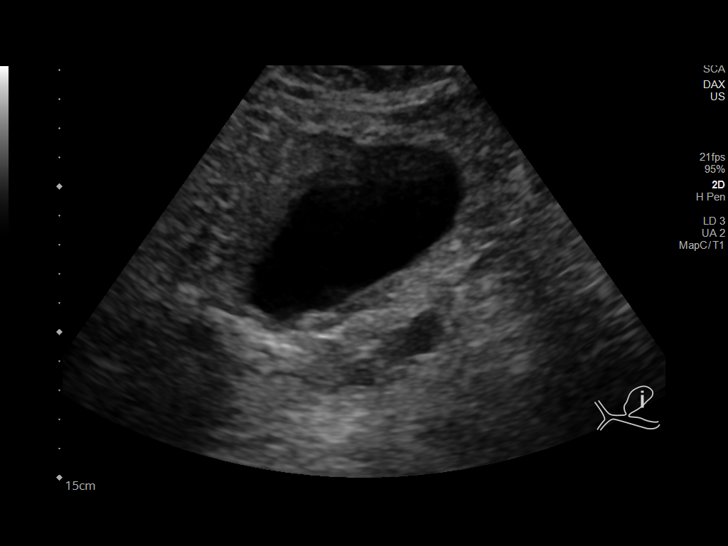
[im 7/73]
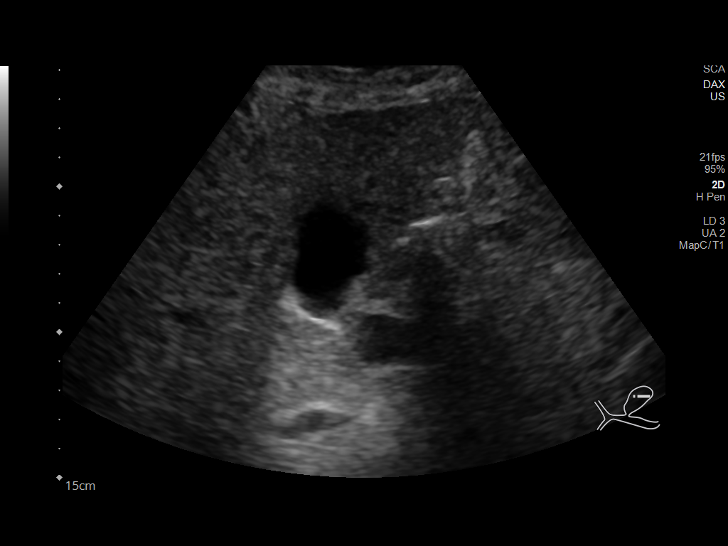
[im 13/73]
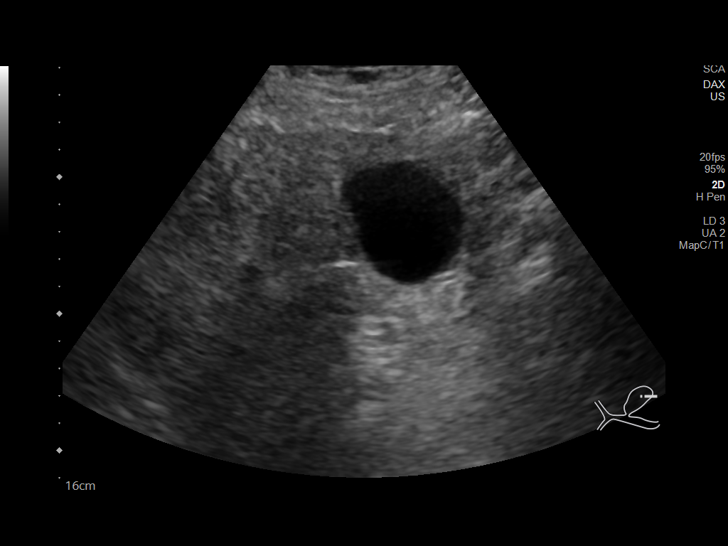
[im 19/73]
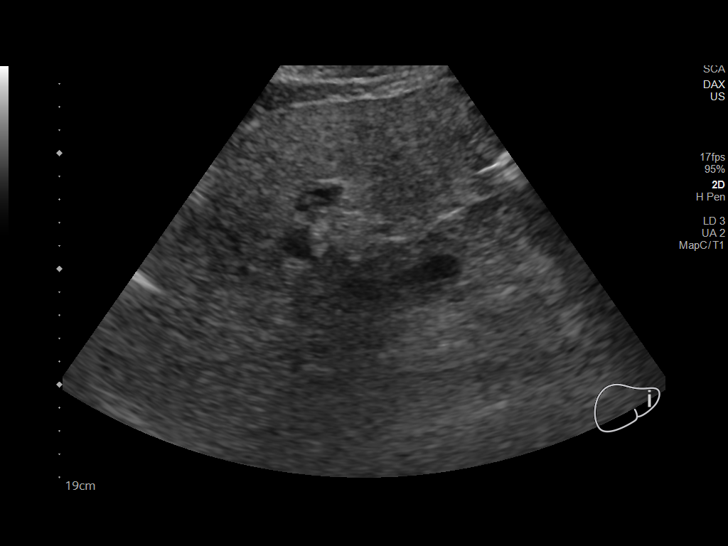
[im 25/73]
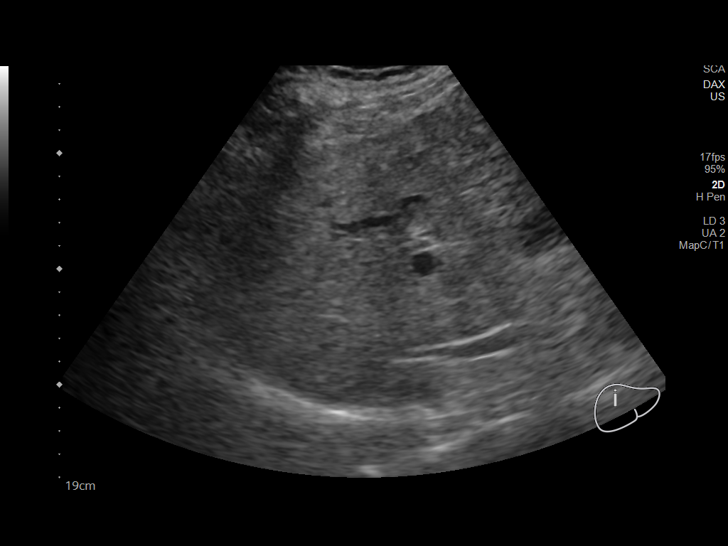
[im 31/73]
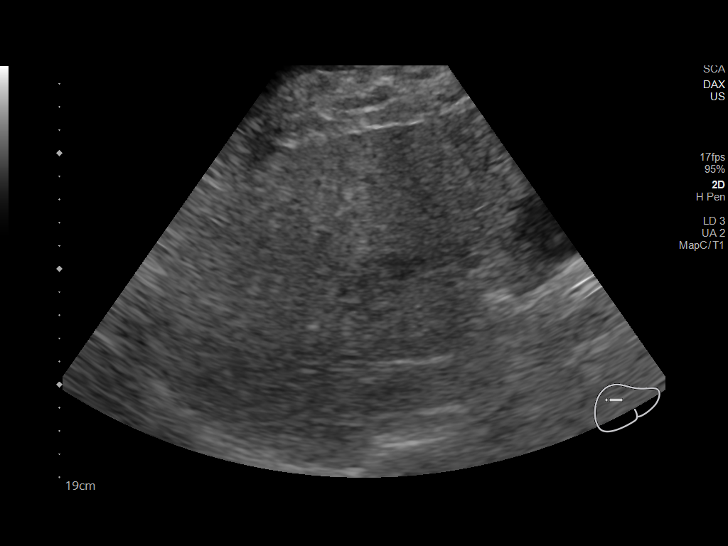
[im 37/73]
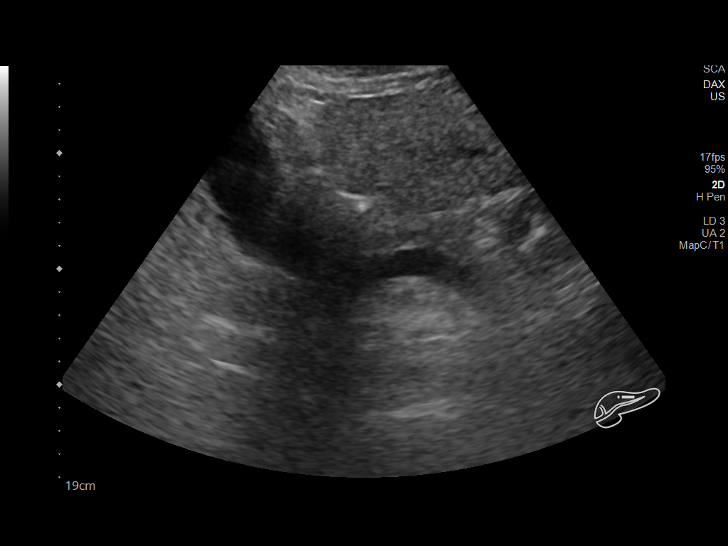
[im 43/73]
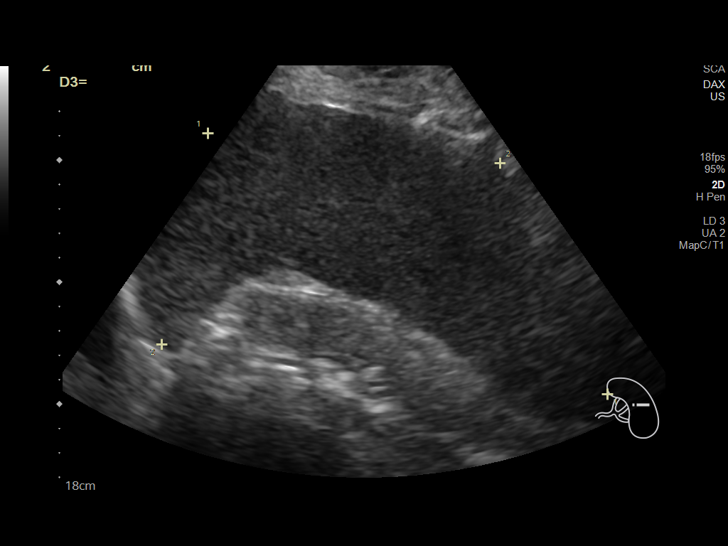
[im 49/73]
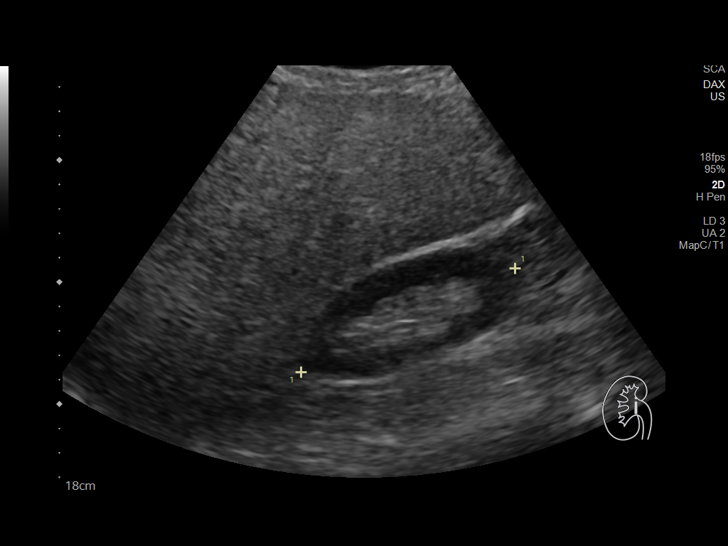
[im 55/73]
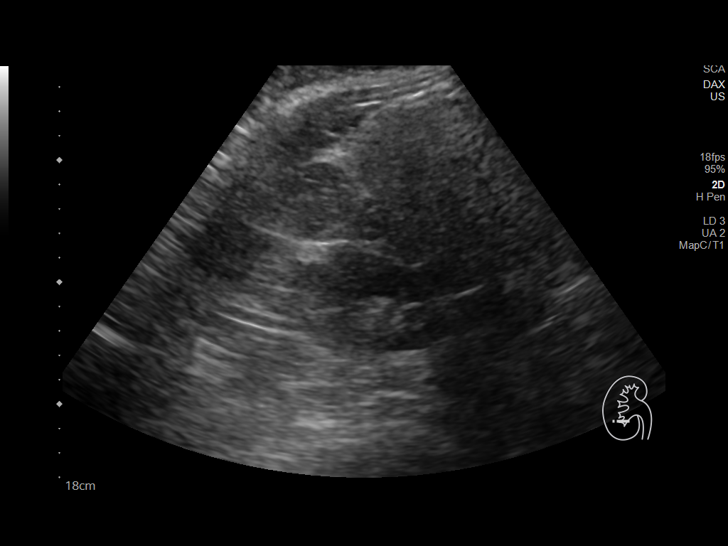
[im 61/73]
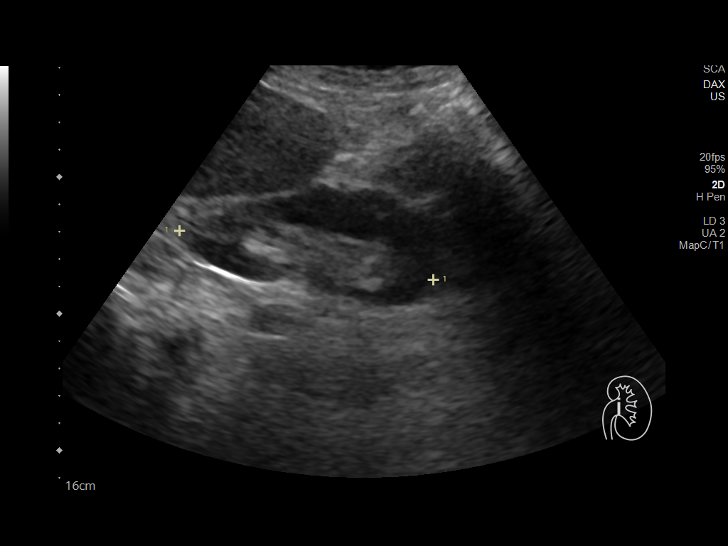
[im 67/73]
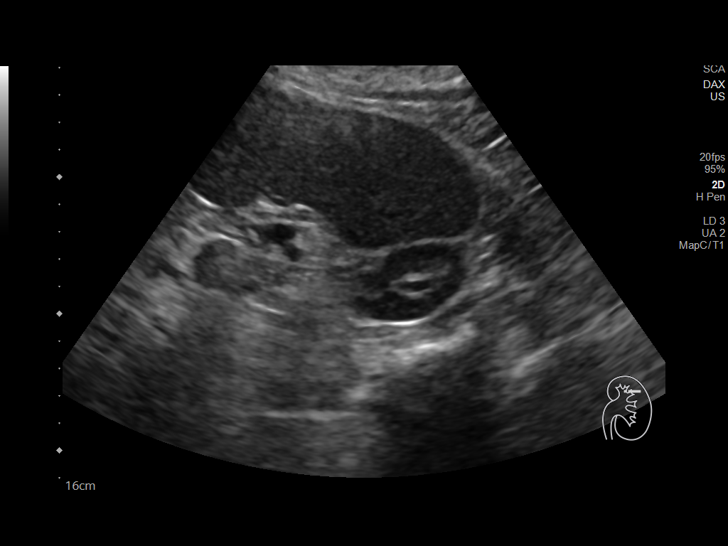
[im 73/73]
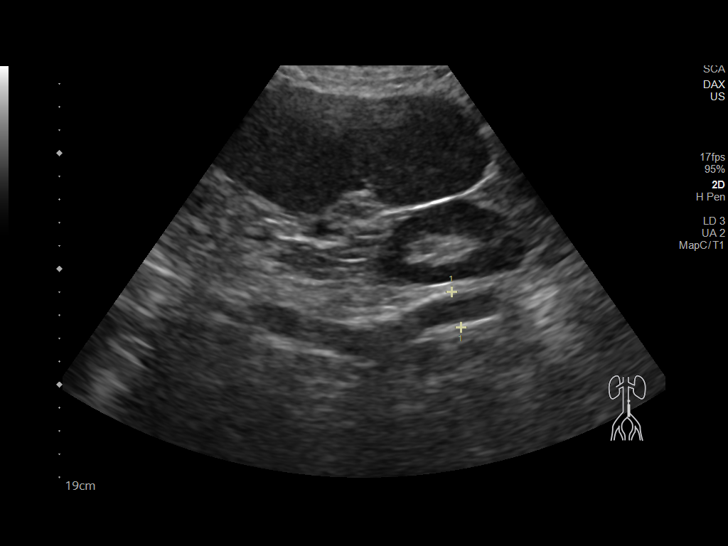

[13 of 25 positions shown; findings below may reference images not displayed]

FINDINGS: Gallbladder: No gallstones or wall thickening visualized. No
sonographic Murphy sign noted by sonographer.

Common bile duct: Diameter: 2 mm in proximal diameter

Liver: The liver parenchyma is diffusely echogenic and there is mild
coarsening of the hepatic echotexture most in keeping with changes
of mild hepatic steatosis. The liver is mildly enlarged. No focal
intrahepatic masses are seen and there is no intrahepatic biliary
ductal dilation. Portal vein is patent on color Doppler imaging with
normal direction of blood flow towards the liver.

IVC: No abnormality visualized.

Pancreas: Visualized portion unremarkable.

Spleen: The spleen is moderately enlarged measuring at least 19.6 x
15.7 x 7.8 cm in greatest dimension with an estimated volume of 1690
cc. No intrasplenic lesions are seen.

Right Kidney: Length: 9.8 cm. Echogenicity within normal limits. No
mass or hydronephrosis visualized.

Left Kidney: Length: 9.5 cm. Echogenicity within normal limits. No
mass or hydronephrosis visualized.

Abdominal aorta: No aneurysm visualized.

Other findings: None.
IMPRESSION: Mild hepatic steatosis and hepatomegaly.

Moderate splenomegaly.

## 2023-02-22 ENCOUNTER — Ambulatory Visit: Payer: BC Managed Care – PPO | Admitting: Podiatry

## 2023-02-22 ENCOUNTER — Encounter: Payer: Self-pay | Admitting: Podiatry

## 2023-02-22 DIAGNOSIS — L600 Ingrowing nail: Secondary | ICD-10-CM

## 2023-02-22 NOTE — Progress Notes (Signed)
  Subjective:  Patient ID: Jaime Rosales, female    DOB: [birth year],   MRN: [id number]  Chief Complaint  Patient presents with   Nail Problem    Bilateral great toe ingrown    62 y.o. female presents for concern of bilateral great ingrown toenails that have been present for a few weeks. Relates she normally gets her nails trimmed in salon but this time pain has been worse. Worried about possible infection.  Denies any other pedal complaints. Denies n/v/f/c.   Past Medical History:  Diagnosis Date   Diabetes mellitus without complication (HCC)    GERD (gastroesophageal reflux disease)    Hx of cesarean section    Hypertension     Objective:  Physical Exam: Vascular: DP/PT pulses 2/4 bilateral. CFT <3 seconds. Normal hair growth on digits. No edema.  Skin. No lacerations or abrasions bilateral feet. Bilateral hallux nail bilateral borders incurvated and tender. No erythema edema or purulence noted.  Musculoskeletal: MMT 5/5 bilateral lower extremities in DF, PF, Inversion and Eversion. Deceased ROM in DF of ankle joint.  Neurological: Sensation intact to light touch.   Assessment:   1. Ingrown right greater toenail   2. Ingrown left greater toenail      Plan:  Patient was evaluated and treated and all questions answered. Discussed ingrown toenails etiology and treatment options including procedure for removal vs conservative care.  Patient opts to go with conservative care for now. Bilateral hallux nails bilateral borders debrided in slant back fashion as courtesy to patient comfort.  Discussed soaks and neosporin and a band aid for now.  Return in future if opts for procedure.    Louann Sjogren, DPM
